# Patient Record
Sex: Male | Born: 1995 | Race: White | Hispanic: No | Marital: Single
Health system: Southern US, Community
[De-identification: ages and names within clinical notes are randomized; demographics above are authoritative.]

---
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
## 2015-06-28 IMAGING — CR FACIAL BONES - 1-2 VIEW
1 series · 5 of 5 positions shown · non-contrast
Comparison: None.

CLINICAL DATA: Right mandibular pain after direct blow

EXAM:
FACIAL BONES - 1-2 VIEW

[Series 2: w waters pa · 0.14mm/px · 5 of 5 slices shown]
[im 1/5]
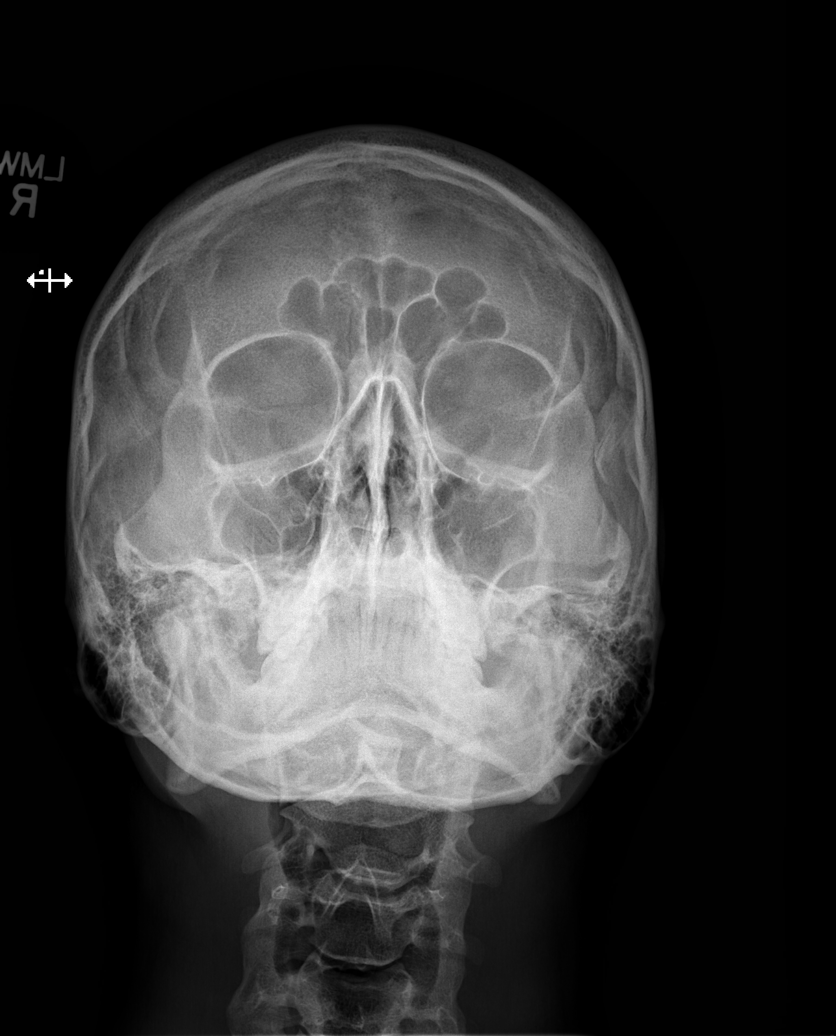
[im 2/5]
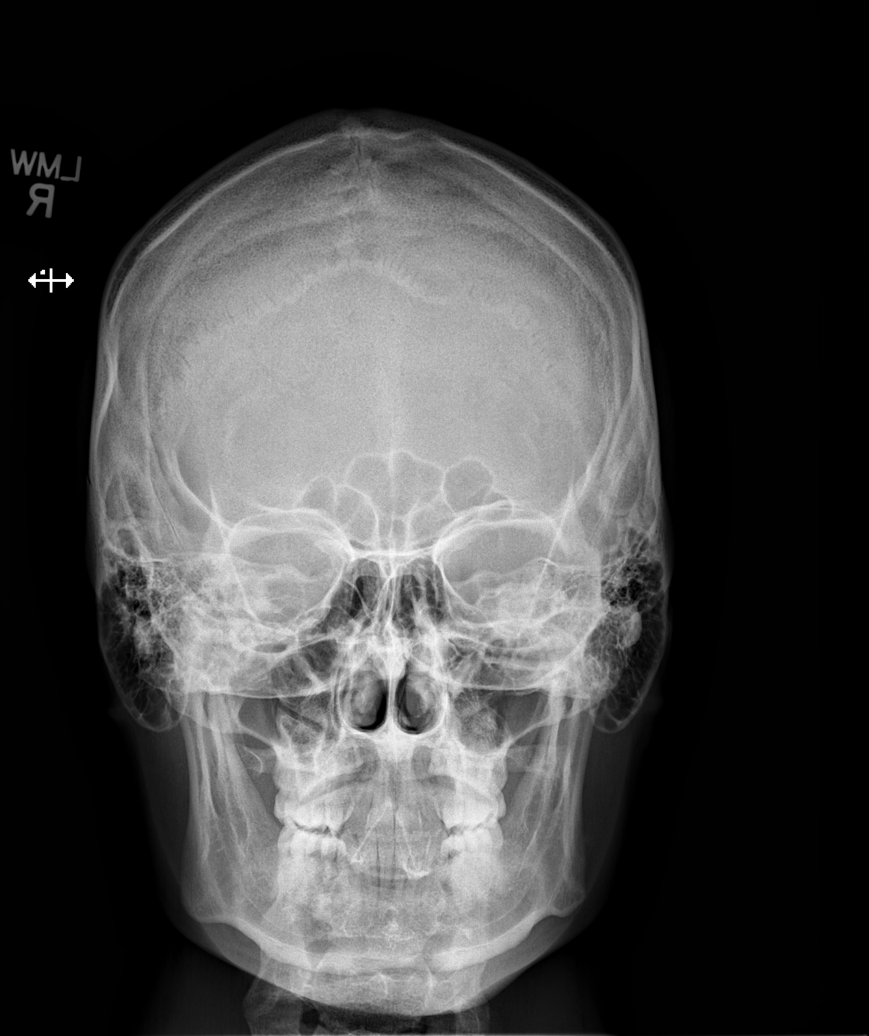
[im 3/5]
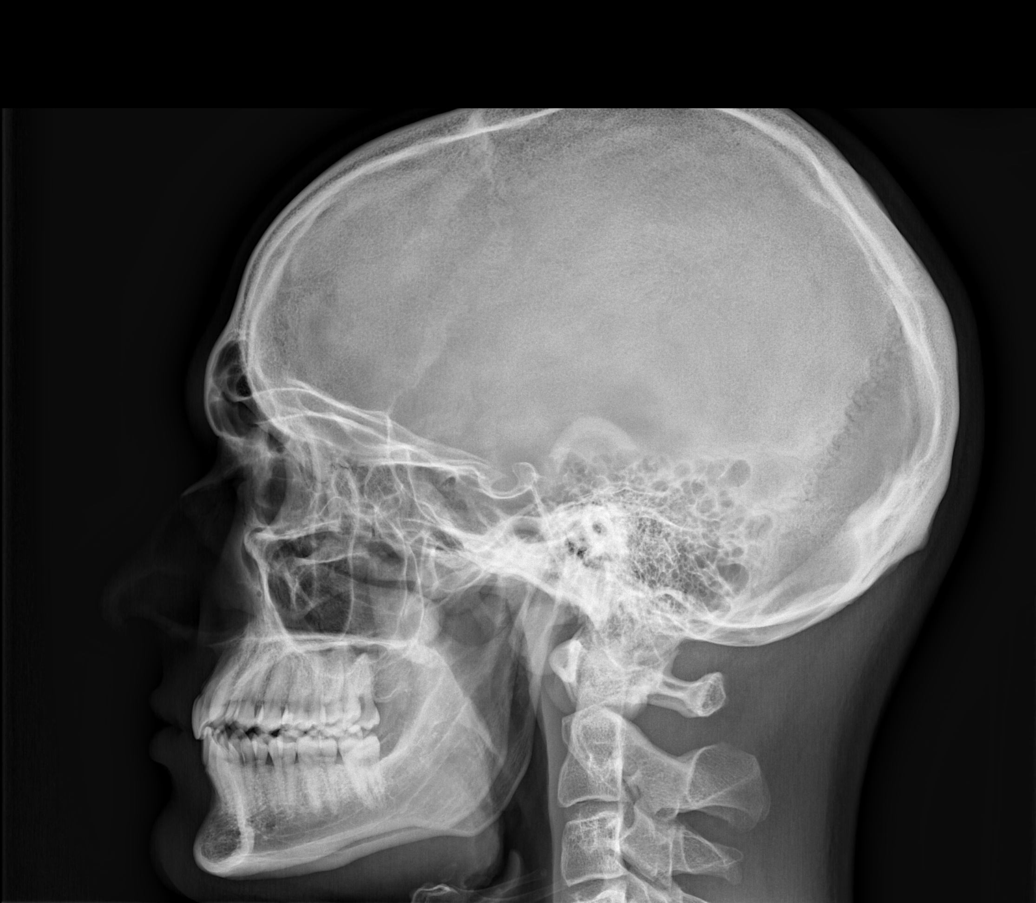
[im 4/5]
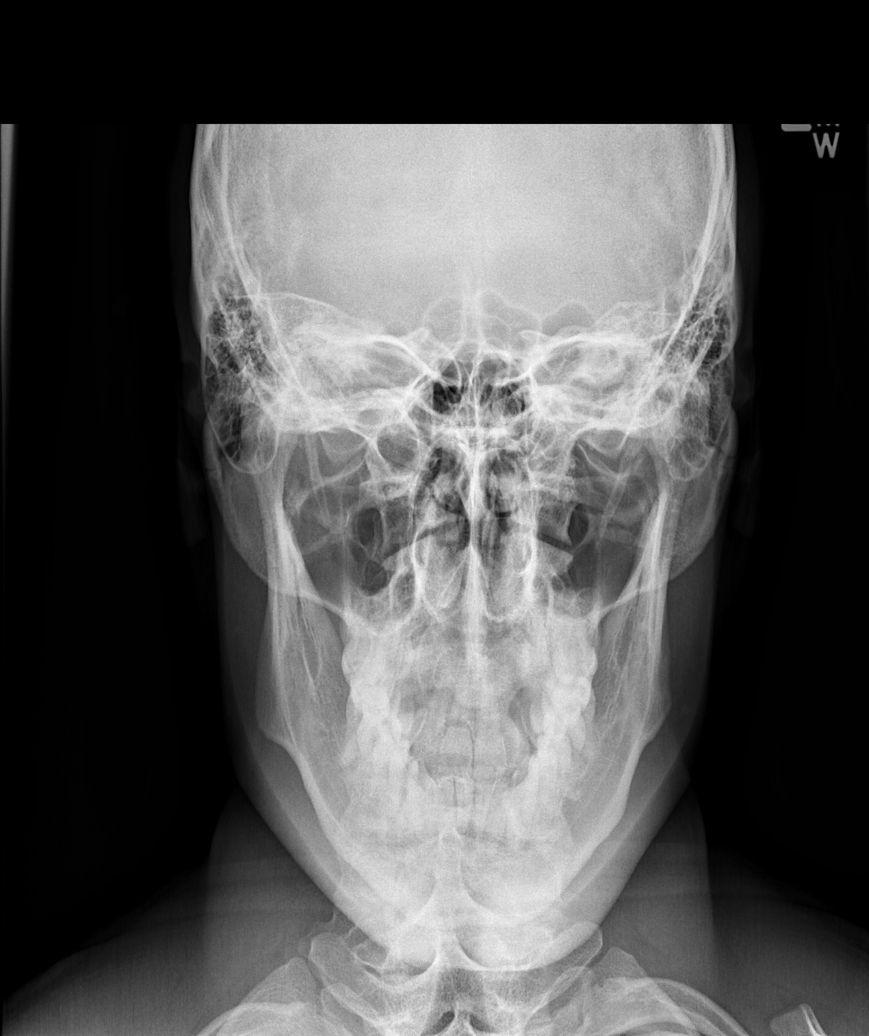
[im 5/5]
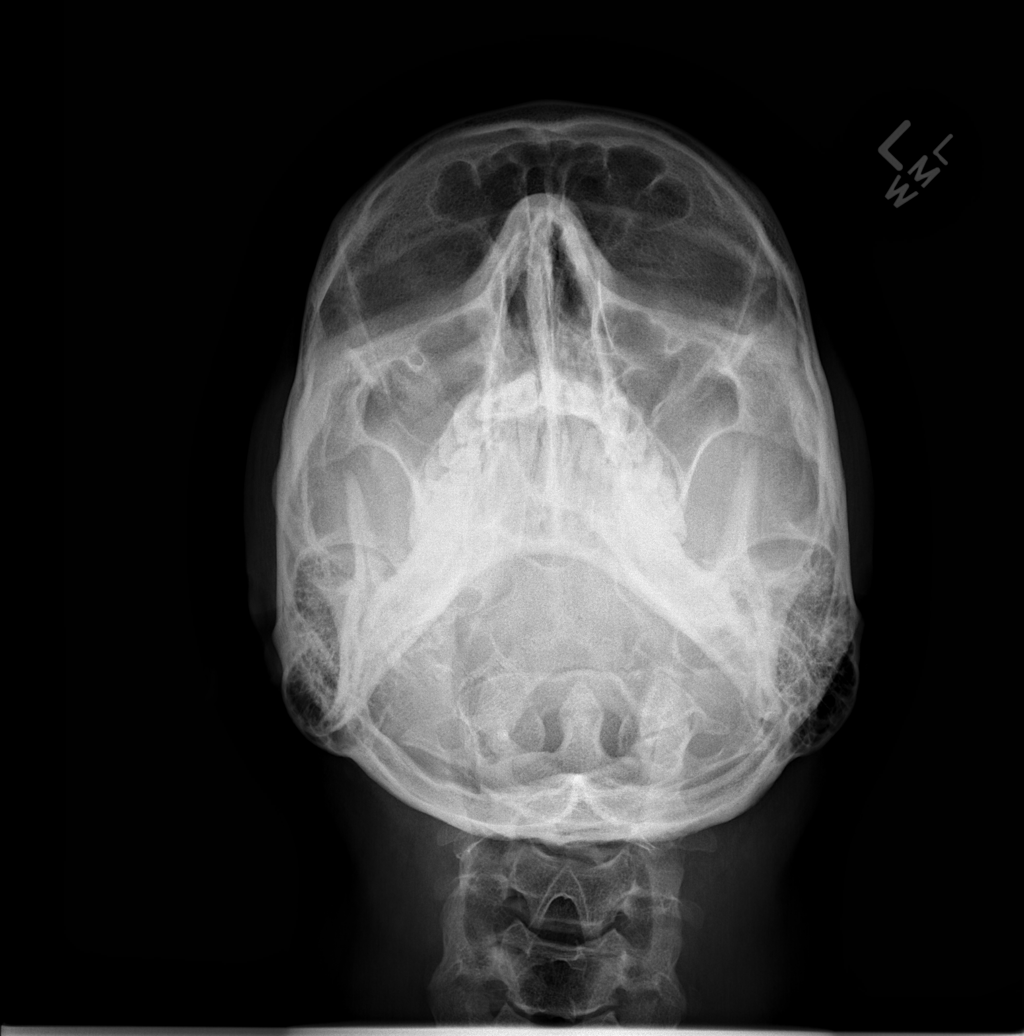

[5 of 5 positions shown; findings below may reference images not displayed]

FINDINGS: No evidence of facial fracture. The mandibular condyles appear
located. No paranasal sinus or mastoid air cell opacification.
IMPRESSION: No acute osseous findings.

## 2019-07-06 ENCOUNTER — Ambulatory Visit: Payer: Self-pay | Attending: Internal Medicine

## 2019-07-06 DIAGNOSIS — Z23 Encounter for immunization: Secondary | ICD-10-CM

## 2019-07-06 NOTE — Progress Notes (Signed)
   Covid-[facility] Vaccination Clinic  Name:  [place] W Millette    MRN: [id number] DOB: April 20, 1995  07/06/2019  Mr. Gros was observed post Covid-19 immunization for 15 minutes without incident. He was provided with Vaccine Information Sheet and instruction to access the V-Safe system.   Mr. Mikal Plane was instructed to call 911 with any severe reactions post vaccine: Marland Kitchen Difficulty breathing  . Swelling of face and throat  . A fast heartbeat  . A bad rash all over body  . Dizziness and weakness   Immunizations Administered    Name Date Dose VIS Date Route   Pfizer COVID-19 Vaccine 07/06/2019  2:01 PM 0.3 mL 03/21/2019 Intramuscular   Manufacturer: [employer], [employer]   Lot: [id number]   NDC: [address]-8

## 2019-07-30 ENCOUNTER — Ambulatory Visit: Payer: Self-pay | Attending: Internal Medicine

## 2019-07-30 DIAGNOSIS — Z23 Encounter for immunization: Secondary | ICD-10-CM

## 2019-07-30 NOTE — Progress Notes (Signed)
   Covid-19 Vaccination Clinic  Name:  Ryan West    MRN: [id number] DOB: 1996/03/24  07/30/2019  Mr. West was observed post Covid-19 immunization for 15 minutes without incident. He was provided with Vaccine Information Sheet and instruction to access the V-Safe system.   [name] was instructed to call 911 with any severe reactions post vaccine: Marland Kitchen Difficulty breathing  . Swelling of face and throat  . A fast heartbeat  . A bad rash all over body  . Dizziness and weakness   Immunizations Administered    Name Date Dose VIS Date Route   Pfizer COVID-19 Vaccine 07/30/2019  3:34 PM 0.3 mL 06/04/2018 Intramuscular   Manufacturer: [employer], [employer]   Lot: [id number]   NDC: [address]-5
